# Patient Record
Sex: Female | Born: 1955 | Race: White | Hispanic: No | Marital: Married | Smoking: Former smoker
Health system: Southern US, Community
[De-identification: ages and names within clinical notes are randomized; demographics above are authoritative.]

## PROBLEM LIST (undated history)

## (undated) DIAGNOSIS — S060XAA Concussion with loss of consciousness status unknown, initial encounter: Secondary | ICD-10-CM

## (undated) DIAGNOSIS — IMO0001 Reserved for inherently not codable concepts without codable children: Secondary | ICD-10-CM

## (undated) DIAGNOSIS — D649 Anemia, unspecified: Secondary | ICD-10-CM

## (undated) DIAGNOSIS — S060X9A Concussion with loss of consciousness of unspecified duration, initial encounter: Secondary | ICD-10-CM

## (undated) DIAGNOSIS — Z5189 Encounter for other specified aftercare: Secondary | ICD-10-CM

## (undated) DIAGNOSIS — K5790 Diverticulosis of intestine, part unspecified, without perforation or abscess without bleeding: Secondary | ICD-10-CM

## (undated) DIAGNOSIS — G40909 Epilepsy, unspecified, not intractable, without status epilepticus: Secondary | ICD-10-CM

## (undated) DIAGNOSIS — M1990 Unspecified osteoarthritis, unspecified site: Secondary | ICD-10-CM

## (undated) HISTORY — DX: Epilepsy, unspecified, not intractable, without status epilepticus: G40.909

## (undated) HISTORY — DX: Unspecified osteoarthritis, unspecified site: M19.90

## (undated) HISTORY — PX: BUNIONECTOMY: SHX129

## (undated) HISTORY — DX: Anemia, unspecified: D64.9

## (undated) HISTORY — DX: Diverticulosis of intestine, part unspecified, without perforation or abscess without bleeding: K57.90

## (undated) HISTORY — PX: COLONOSCOPY: SHX174

## (undated) HISTORY — DX: Concussion with loss of consciousness of unspecified duration, initial encounter: S06.0X9A

## (undated) HISTORY — PX: OTHER SURGICAL HISTORY: SHX169

## (undated) HISTORY — DX: Concussion with loss of consciousness status unknown, initial encounter: S06.0XAA

---
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
## 1998-03-05 ENCOUNTER — Other Ambulatory Visit: Admission: RE | Admit: 1998-03-05 | Discharge: 1998-03-05 | Payer: Self-pay | Admitting: Obstetrics and Gynecology

## 2000-04-07 HISTORY — PX: ABDOMINAL HYSTERECTOMY: SHX81

## 2002-12-21 ENCOUNTER — Emergency Department (HOSPITAL_COMMUNITY): Admission: EM | Admit: 2002-12-21 | Discharge: 2002-12-21 | Payer: Self-pay | Admitting: Emergency Medicine

## 2002-12-21 ENCOUNTER — Encounter: Payer: Self-pay | Admitting: Emergency Medicine

## 2005-07-25 ENCOUNTER — Emergency Department (HOSPITAL_COMMUNITY): Admission: EM | Admit: 2005-07-25 | Discharge: 2005-07-25 | Payer: Self-pay | Admitting: Emergency Medicine

## 2009-09-11 ENCOUNTER — Ambulatory Visit (HOSPITAL_COMMUNITY): Admission: RE | Admit: 2009-09-11 | Discharge: 2009-09-11 | Payer: Self-pay | Admitting: Family Medicine

## 2010-07-23 ENCOUNTER — Other Ambulatory Visit (HOSPITAL_COMMUNITY): Payer: Self-pay | Admitting: Internal Medicine

## 2010-07-23 ENCOUNTER — Ambulatory Visit (HOSPITAL_COMMUNITY)
Admission: RE | Admit: 2010-07-23 | Discharge: 2010-07-23 | Disposition: A | Discharge status: Home / Self Care | Payer: BLUE CROSS/BLUE SHIELD | Source: Ambulatory Visit | Attending: Internal Medicine | Admitting: Internal Medicine

## 2010-07-23 DIAGNOSIS — R109 Unspecified abdominal pain: Secondary | ICD-10-CM

## 2010-09-17 ENCOUNTER — Ambulatory Visit (INDEPENDENT_AMBULATORY_CARE_PROVIDER_SITE_OTHER): Payer: BLUE CROSS/BLUE SHIELD | Admitting: Internal Medicine

## 2010-10-15 ENCOUNTER — Ambulatory Visit (INDEPENDENT_AMBULATORY_CARE_PROVIDER_SITE_OTHER): Payer: BLUE CROSS/BLUE SHIELD | Admitting: Internal Medicine

## 2011-03-17 ENCOUNTER — Other Ambulatory Visit (HOSPITAL_COMMUNITY): Payer: Self-pay | Admitting: Family Medicine

## 2011-03-17 DIAGNOSIS — Z139 Encounter for screening, unspecified: Secondary | ICD-10-CM

## 2011-03-20 ENCOUNTER — Ambulatory Visit (HOSPITAL_COMMUNITY): Payer: BC Managed Care – PPO

## 2011-05-26 ENCOUNTER — Encounter (INDEPENDENT_AMBULATORY_CARE_PROVIDER_SITE_OTHER): Payer: Self-pay | Admitting: *Deleted

## 2011-06-03 ENCOUNTER — Other Ambulatory Visit (HOSPITAL_COMMUNITY): Payer: Self-pay | Admitting: Internal Medicine

## 2011-06-03 ENCOUNTER — Ambulatory Visit (HOSPITAL_COMMUNITY)
Admission: RE | Admit: 2011-06-03 | Discharge: 2011-06-03 | Disposition: A | Discharge status: Home / Self Care | Payer: BC Managed Care – PPO | Source: Ambulatory Visit | Attending: Internal Medicine | Admitting: Internal Medicine

## 2011-06-03 DIAGNOSIS — M13179 Monoarthritis, not elsewhere classified, unspecified ankle and foot: Secondary | ICD-10-CM

## 2011-06-03 DIAGNOSIS — M7730 Calcaneal spur, unspecified foot: Secondary | ICD-10-CM | POA: Insufficient documentation

## 2011-06-03 DIAGNOSIS — Z9889 Other specified postprocedural states: Secondary | ICD-10-CM | POA: Insufficient documentation

## 2011-06-03 DIAGNOSIS — M79609 Pain in unspecified limb: Secondary | ICD-10-CM | POA: Insufficient documentation

## 2011-07-29 NOTE — Progress Notes (Signed)
Received a referral from [facility] for pt for screening colonoscopy on 07/28/2011. Saw that Luster Landsberg from Dr. Patty Sermons office has sent a previous letter to pt dated 05/26/2011 to schedule colonoscopy. No letter sent to pt today.

## 2011-07-31 ENCOUNTER — Ambulatory Visit (HOSPITAL_COMMUNITY): Payer: BC Managed Care – PPO

## 2011-09-25 ENCOUNTER — Encounter: Payer: Self-pay | Admitting: Gastroenterology

## 2011-11-14 ENCOUNTER — Encounter: Payer: BC Managed Care – PPO | Admitting: Gastroenterology

## 2011-12-03 ENCOUNTER — Other Ambulatory Visit: Payer: Self-pay

## 2011-12-03 ENCOUNTER — Encounter (HOSPITAL_COMMUNITY): Payer: Self-pay | Admitting: Emergency Medicine

## 2011-12-03 ENCOUNTER — Emergency Department (HOSPITAL_COMMUNITY): Payer: BC Managed Care – PPO

## 2011-12-03 ENCOUNTER — Emergency Department (HOSPITAL_COMMUNITY)
Admission: EM | Admit: 2011-12-03 | Discharge: 2011-12-03 | Disposition: A | Discharge status: Home / Self Care | Payer: BC Managed Care – PPO | Attending: Emergency Medicine | Admitting: Emergency Medicine

## 2011-12-03 DIAGNOSIS — R55 Syncope and collapse: Secondary | ICD-10-CM | POA: Insufficient documentation

## 2011-12-03 DIAGNOSIS — R0602 Shortness of breath: Secondary | ICD-10-CM | POA: Insufficient documentation

## 2011-12-03 DIAGNOSIS — Z7982 Long term (current) use of aspirin: Secondary | ICD-10-CM | POA: Insufficient documentation

## 2011-12-03 HISTORY — DX: Encounter for other specified aftercare: Z51.89

## 2011-12-03 HISTORY — DX: Reserved for inherently not codable concepts without codable children: IMO0001

## 2011-12-03 LAB — BASIC METABOLIC PANEL
BUN: 10 mg/dL (ref 6–23)
CO2: 25 mEq/L (ref 19–32)
Chloride: 105 mEq/L (ref 96–112)
GFR calc Af Amer: >90 mL/min (ref >90)
Glucose, Bld: 112 mg/dL — ABNORMAL HIGH (ref 70–99)
Potassium: 3.9 mEq/L (ref 3.5–5.1)

## 2011-12-03 LAB — CBC WITH DIFFERENTIAL/PLATELET
Basophils Relative: 0 % (ref 0–1)
HCT: 38.2 % (ref 36.0–46.0)
Hemoglobin: 13.1 g/dL (ref 12.0–15.0)
Lymphocytes Relative: 24 % (ref 12–46)
Lymphs Abs: 1.6 10*3/uL (ref 0.7–4.0)
Monocytes Absolute: 0.4 10*3/uL (ref 0.1–1.0)
Monocytes Relative: 5 % (ref 3–12)
Neutro Abs: 4.5 10*3/uL (ref 1.7–7.7)
Neutrophils Relative %: 70 % (ref 43–77)
RBC: 4.43 MIL/uL (ref 3.87–5.11)
WBC: 6.5 10*3/uL (ref 4.0–10.5)

## 2011-12-03 NOTE — ED Notes (Signed)
Patient complained of being dizzy when changing from a lying to sitting position. Patient said she was not dizzy when changing from a sitting position to a standing position.

## 2011-12-03 NOTE — ED Notes (Signed)
Pt started having chest pains. Went to [facility] they told her to come here. Saturday was in attic and got into installation and she believes she breathed it in. Had a sore chest, and bloody nose. Pt states breathing started feeling "tight" lightheaded felt like she was going to pass out.

## 2011-12-03 NOTE — ED Provider Notes (Signed)
History   This chart was scribed for Donnetta Hutching, MD by Melba Coon. The patient was seen in room APA18/APA18 and the patient's care was started at 3:32PM.    CSN: [id number]  Arrival date & time 12/03/11  1514   First MD Initiated Contact with Patient 12/03/11 1512      Chief Complaint  Patient presents with  . Chest Pain    (Consider location/radiation/quality/duration/timing/severity/associated sxs/prior treatment) The history is provided by the patient. No language interpreter was used.   Elizabeth Raymond is a 56 y.o. female who presents to the Emergency Department complaining of intermittent, moderate to severe chest tightness with associated SOB, lightheadedness, and near syncope with an onset 12:30PM today. Pt states that she was at work when she felt the present symptoms along with labored breathing and tongue tingling. Pt states that she felt "blood pumping thru her neck." Pt was heading to her PCP's office with her daughter driving, but she felt so bad that she told her daughter to take her back to her job and call for EMS. Pt's daughter states that she looks better at time of exam. No HA, fever, neck pain, sore throat, rash, back pain, abd pain, n/v/d, dysuria, or extremity pain, edema, weakness, numbness, or tingling. No other pertinent medical symptoms.  PCP: Dr. Kandy Garrison  Past Medical History  Diagnosis Date  . Blood transfusion     Past Surgical History  Procedure Date  . Abdominal hysterectomy 2002  . Fracture surgery 1976    History reviewed. No pertinent family history.  History  Substance Use Topics  . Smoking status: Never Smoker   . Smokeless tobacco: Not on file  . Alcohol Use: No    OB History    Grav Para Term Preterm Abortions TAB SAB Ect Mult Living                  Review of Systems 10 Systems reviewed and all are negative for acute change except as noted in the HPI.   Allergies  Darvocet  Home Medications   Current Outpatient Rx    Name Route Sig Dispense Refill  . ASPIRIN 81 MG PO CHEW Oral Chew 81 mg by mouth once as needed. For chest pain/symptoms      BP 137/84  Pulse 90  Temp 98.6 F (37 C) (Oral)  SpO2 97%  Physical Exam  Nursing note and vitals reviewed. Constitutional: She is oriented to person, place, and time. She appears well-developed and well-nourished.  HENT:  Head: Normocephalic and atraumatic.  Eyes: EOM are normal. Pupils are equal, round, and reactive to light.  Neck: Normal range of motion. Neck supple.  Cardiovascular: Normal rate, normal heart sounds and intact distal pulses.   Pulmonary/Chest: Effort normal and breath sounds normal.  Abdominal: Bowel sounds are normal. She exhibits no distension. There is no tenderness.  Musculoskeletal: Normal range of motion. She exhibits no edema and no tenderness.  Neurological: She is alert and oriented to person, place, and time. She has normal strength. No cranial nerve deficit or sensory deficit.  Skin: Skin is warm and dry. No rash noted.  Psychiatric: She has a normal mood and affect.    ED Course  Procedures (including critical care time)  DIAGNOSTIC STUDIES: Oxygen Saturation is 97% on room air, normal by my interpretation.    COORDINATION OF CARE:  3:37PM - CXR, EKG, and blood w/u will be ordered for the pt.   Labs Reviewed  BASIC METABOLIC PANEL - Abnormal;  Notable for the following:    Glucose, Bld 112 (*)     All other components within normal limits  CBC WITH DIFFERENTIAL  TROPONIN I   Dg Chest Port 1 View  12/03/2011  *RADIOLOGY REPORT*  Clinical Data: Chest pain  PORTABLE CHEST - 1 VIEW  Comparison: Portable exam 1558 hours without priors for comparison  Findings: Normal heart size, mediastinal contours, and pulmonary vascularity. Lungs clear. No pleural effusion or pneumothorax. No acute osseous findings. Question mild osseous demineralization.  IMPRESSION: No acute abnormalities.   Original Report Authenticated By: Lollie Marrow, M.D.      No diagnosis found.   Date: 12/03/2011  Rate: 84  Rhythm: normal sinus rhythm  QRS Axis: normal  Intervals: normal  ST/T Wave abnormalities: normal  Conduction Disutrbances:none  Narrative Interpretation:   Old EKG Reviewed: none available   MDM  Normal physical exam and normal laboratory data including EKG.  Patient has been monitored in the emergency department for 3 hours without ectopy     I personally performed the services described in this documentation, which was scribed in my presence. The recorded information has been reviewed and considered.       Donnetta Hutching, MD 12/03/11 [phone]

## 2011-12-16 ENCOUNTER — Ambulatory Visit (AMBULATORY_SURGERY_CENTER): Payer: BC Managed Care – PPO

## 2011-12-16 ENCOUNTER — Encounter: Payer: Self-pay | Admitting: Gastroenterology

## 2011-12-16 VITALS — Ht 64.0 in | Wt 169.1 lb

## 2011-12-16 DIAGNOSIS — Z1211 Encounter for screening for malignant neoplasm of colon: Secondary | ICD-10-CM

## 2011-12-16 DIAGNOSIS — K649 Unspecified hemorrhoids: Secondary | ICD-10-CM

## 2011-12-16 MED ORDER — MOVIPREP 100 G PO SOLR: ORAL | Status: DC

## 2011-12-24 ENCOUNTER — Ambulatory Visit (AMBULATORY_SURGERY_CENTER): Payer: BC Managed Care – PPO | Admitting: Gastroenterology

## 2011-12-24 ENCOUNTER — Encounter: Payer: Self-pay | Admitting: Gastroenterology

## 2011-12-24 ENCOUNTER — Encounter: Payer: BC Managed Care – PPO | Admitting: Gastroenterology

## 2011-12-24 VITALS — BP 133/76 | HR 85 | Temp 98.2°F | Resp 18 | Ht 64.0 in | Wt 169.0 lb

## 2011-12-24 DIAGNOSIS — K5730 Diverticulosis of large intestine without perforation or abscess without bleeding: Secondary | ICD-10-CM

## 2011-12-24 DIAGNOSIS — K635 Polyp of colon: Secondary | ICD-10-CM

## 2011-12-24 DIAGNOSIS — D126 Benign neoplasm of colon, unspecified: Secondary | ICD-10-CM

## 2011-12-24 DIAGNOSIS — Z1211 Encounter for screening for malignant neoplasm of colon: Secondary | ICD-10-CM

## 2011-12-24 MED ORDER — SODIUM CHLORIDE 0.9 % IV SOLN: 500.0000 mL | INTRAVENOUS | Status: DC

## 2011-12-24 NOTE — Op Note (Signed)
[facility] [address] [place], [address]   COLONOSCOPY PROCEDURE REPORT  PATIENT: Elizabeth Raymond, Elizabeth Raymond  MR#: [id number] BIRTHDATE: 05/30/1955 , 56  yrs. old GENDER: Female ENDOSCOPIST: Mardella Layman, MD, Clementeen Graham REFERRED BY:  Assunta Found, M.D. PROCEDURE DATE:  12/24/2011 PROCEDURE:   Colonoscopy with snare polypectomy ASA CLASS:   Class II INDICATIONS:average risk patient for colon cancer. MEDICATIONS: propofol (Diprivan) 350mg  IV  DESCRIPTION OF PROCEDURE:   After the risks and benefits and of the procedure were explained, informed consent was obtained.  A digital rectal exam revealed no abnormalities of the rectum.    The LB [id number]  endoscope was introduced through the anus and advanced to the cecum, which was identified by both the appendix and ileocecal valve .  The quality of the prep was excellent, using MoviPrep .  The instrument was then slowly withdrawn as the colon was fully examined.     COLON FINDINGS: There was severe diverticulosis noted in the descending colon and sigmoid colon with associated muscular hypertrophy and tortuosity.   A polypoid shaped pedunculated polyp measuring 1.5 cm in size was found at the splenic flexure.  A polypectomy was performed using snare cautery.  The resection was complete and the polyp tissue was completely retrieved.   The colon mucosa was otherwise normal.     Retroflexed views revealed external hemorrhoids.     The scope was then withdrawn from the patient and the procedure completed.  COMPLICATIONS: There were no complications. ENDOSCOPIC IMPRESSION: 1.   There was severe diverticulosis noted in the descending colon and sigmoid colon 2.   Pedunculated polyp measuring 1.5 cm in size was found at the splenic flexure; polypectomy was performed using snare cautery 3.   The colon mucosa was otherwise normal  RECOMMENDATIONS: 1.  continue current medications 2.  High fiber diet 3.  Repeat  colonoscopy in 5 years if polyp adenomatous; otherwise 10 years   REPEAT EXAM:  cc:  _______________________________ eSignedMardella Layman, MD, [facility] 12/24/2011 2:01 PM

## 2011-12-24 NOTE — Patient Instructions (Addendum)
Handouts were given to your care partner on polyps, diverticulosis and high fiber diet.   You may resume your current medications today.  Please call if any questions or concerns.   YOU HAD AN ENDOSCOPIC PROCEDURE TODAY AT THE [facility]: Refer to the procedure report that was given to you for any specific questions about what was found during the examination.  If the procedure report does not answer your questions, please call your gastroenterologist to clarify.  If you requested that your care partner not be given the details of your procedure findings, then the procedure report has been included in a sealed envelope for you to review at your convenience later.  YOU SHOULD EXPECT: Some feelings of bloating in the abdomen. Passage of more gas than usual.  Walking can help get rid of the air that was put into your GI tract during the procedure and reduce the bloating. If you had a lower endoscopy (such as a colonoscopy or flexible sigmoidoscopy) you may notice spotting of blood in your stool or on the toilet paper. If you underwent a bowel prep for your procedure, then you may not have a normal bowel movement for a few days.  DIET: Your first meal following the procedure should be a light meal and then it is ok to progress to your normal diet.  A half-sandwich or bowl of soup is an example of a good first meal.  Heavy or fried foods are harder to digest and may make you feel nauseous or bloated.  Likewise meals heavy in dairy and vegetables can cause extra gas to form and this can also increase the bloating.  Drink plenty of fluids but you should avoid alcoholic beverages for 24 hours.  ACTIVITY: Your care partner should take you home directly after the procedure.  You should plan to take it easy, moving slowly for the rest of the day.  You can resume normal activity the day after the procedure however you should NOT DRIVE or use heavy machinery for 24 hours (because of the sedation medicines  used during the test).    SYMPTOMS TO REPORT IMMEDIATELY: A gastroenterologist can be reached at any hour.  During normal business hours, 8:30 AM to 5:00 PM Monday through Friday, call [phone].  After hours and on weekends, please call the GI answering service at [phone] who will take a message and have the physician on call contact you.   Following lower endoscopy (colonoscopy or flexible sigmoidoscopy):  Excessive amounts of blood in the stool  Significant tenderness or worsening of abdominal pains  Swelling of the abdomen that is new, acute  Fever of 100F or higher    FOLLOW UP: If any biopsies were taken you will be contacted by phone or by letter within the next 1-3 weeks.  Call your gastroenterologist if you have not heard about the biopsies in 3 weeks.  Our staff will call the home number listed on your records the next business day following your procedure to check on you and address any questions or concerns that you may have at that time regarding the information given to you following your procedure. This is a courtesy call and so if there is no answer at the home number and we have not heard from you through the emergency physician on call, we will assume that you have returned to your regular daily activities without incident.  SIGNATURES/CONFIDENTIALITY: You and/or your care partner have signed paperwork which will be entered into   your electronic medical record.  These signatures attest to the fact that that the information above on your After Visit Summary has been reviewed and is understood.  Full responsibility of the confidentiality of this discharge information lies with you and/or your care-partner.  

## 2011-12-24 NOTE — Progress Notes (Signed)
Cough has lessen after the water. Maw

## 2011-12-24 NOTE — Progress Notes (Signed)
The pt has a non-productive cough.  Once she was awake I offered her water.  I encouged her to take a tiny sip.  Maw

## 2011-12-24 NOTE — Progress Notes (Signed)
No complaints noted in the recovery room. Maw  Patient did not experience any of the following events: a burn prior to discharge; a fall within the facility; wrong site/side/patient/procedure/implant event; or a hospital transfer or hospital admission upon discharge from the facility. (G8907) Patient did not have preoperative order for IV antibiotic SSI prophylaxis. (G8918)  

## 2011-12-25 ENCOUNTER — Telehealth: Payer: Self-pay

## 2011-12-25 NOTE — Telephone Encounter (Signed)
  Follow up Call-  Call back number 12/24/2011  Post procedure Call Back phone  # [phone]  Permission to leave phone message Yes     Patient questions:  Do you have a fever, pain , or abdominal swelling? no Pain Score  0 *  Have you tolerated food without any problems? yes  Have you been able to return to your normal activities? yes  Do you have any questions about your discharge instructions: Diet   no Medications  no Follow up visit  no  Do you have questions or concerns about your Care? no  Actions: * If pain score is 4 or above: No action needed, pain <4.

## 2011-12-26 ENCOUNTER — Telehealth: Payer: Self-pay | Admitting: Gastroenterology

## 2011-12-26 ENCOUNTER — Ambulatory Visit (INDEPENDENT_AMBULATORY_CARE_PROVIDER_SITE_OTHER)
Admission: RE | Admit: 2011-12-26 | Discharge: 2011-12-26 | Disposition: A | Discharge status: Home / Self Care | Payer: BC Managed Care – PPO | Source: Ambulatory Visit | Attending: Physician Assistant | Admitting: Physician Assistant

## 2011-12-26 ENCOUNTER — Ambulatory Visit: Payer: BC Managed Care – PPO | Admitting: Physician Assistant

## 2011-12-26 DIAGNOSIS — R0602 Shortness of breath: Secondary | ICD-10-CM

## 2011-12-26 DIAGNOSIS — R05 Cough: Secondary | ICD-10-CM

## 2011-12-26 NOTE — Telephone Encounter (Signed)
Pt misunderstood and went to XRAY only and asked what else she was to do and they didn't know so they sent her home. Per Mike Gip, PA, her chest xray was clear and no signs of aspiration pneumonia; we didn't have a CBC. Since she did not c/o abdominal pain, we will treat it as a bad cough. Use OTC Robitussin DM or another cough syrup; if the cough or SOB worsens, please go to the ER or UC. Please update [place] on Monday; pt stated understanding.

## 2011-12-26 NOTE — Telephone Encounter (Signed)
Pt had COLON on 12/24/11 with polypectomy on pedunculated polyp at splenic flexure. She reports she woke up coughing from the procedure and the coughing has gotten worse and she can't take a deep breath and has a rattle in her chest. She is having BMs, is passing gas, and denies being bloated. Pt advised to come in for a 3:30pm appt unless I call her back. Amy, anything to offer instead of OV? Thanks.

## 2011-12-26 NOTE — Telephone Encounter (Signed)
lmom for pt to call back. Per Mike Gip, PA, have CBC and Chest Xray done prior to her visit at 3:30pm today; r/o aspiration pneumonia.

## 2011-12-30 ENCOUNTER — Encounter: Payer: Self-pay | Admitting: Gastroenterology

## 2015-06-05 ENCOUNTER — Ambulatory Visit (HOSPITAL_COMMUNITY)
Admission: RE | Admit: 2015-06-05 | Discharge: 2015-06-05 | Disposition: A | Discharge status: Home / Self Care | Payer: BC Managed Care – PPO | Source: Ambulatory Visit | Attending: Preventative Medicine | Admitting: Preventative Medicine

## 2015-06-05 ENCOUNTER — Other Ambulatory Visit (HOSPITAL_COMMUNITY): Payer: Self-pay | Admitting: Preventative Medicine

## 2015-06-05 DIAGNOSIS — R1032 Left lower quadrant pain: Secondary | ICD-10-CM | POA: Diagnosis not present

## 2015-06-05 MED ORDER — DIATRIZOATE MEGLUMINE & SODIUM 66-10 % PO SOLN
ORAL | Status: AC
  Filled 2015-06-05: qty 30

## 2015-06-05 MED ORDER — IOHEXOL 300 MG/ML  SOLN
100.0000 mL | Freq: Once | INTRAMUSCULAR | Status: AC | PRN
  Administered 2015-06-05: 100 mL via INTRAVENOUS

## 2015-07-10 ENCOUNTER — Other Ambulatory Visit (HOSPITAL_COMMUNITY): Payer: Self-pay | Admitting: Family Medicine

## 2015-07-10 DIAGNOSIS — Z139 Encounter for screening, unspecified: Secondary | ICD-10-CM

## 2015-07-10 DIAGNOSIS — Z1231 Encounter for screening mammogram for malignant neoplasm of breast: Secondary | ICD-10-CM

## 2015-07-13 ENCOUNTER — Ambulatory Visit (HOSPITAL_COMMUNITY): Payer: BC Managed Care – PPO

## 2015-07-23 ENCOUNTER — Ambulatory Visit (HOSPITAL_COMMUNITY)
Admission: RE | Admit: 2015-07-23 | Discharge: 2015-07-23 | Disposition: A | Discharge status: Home / Self Care | Payer: BC Managed Care – PPO | Source: Ambulatory Visit | Attending: Family Medicine | Admitting: Family Medicine

## 2015-07-23 DIAGNOSIS — Z1231 Encounter for screening mammogram for malignant neoplasm of breast: Secondary | ICD-10-CM

## 2016-06-19 ENCOUNTER — Other Ambulatory Visit (HOSPITAL_COMMUNITY): Payer: Self-pay | Admitting: Internal Medicine

## 2016-06-19 DIAGNOSIS — Z1231 Encounter for screening mammogram for malignant neoplasm of breast: Secondary | ICD-10-CM

## 2016-07-23 ENCOUNTER — Ambulatory Visit (HOSPITAL_COMMUNITY): Payer: BC Managed Care – PPO

## 2016-08-04 ENCOUNTER — Ambulatory Visit (HOSPITAL_COMMUNITY)
Admission: RE | Admit: 2016-08-04 | Discharge: 2016-08-04 | Disposition: A | Discharge status: Home / Self Care | Payer: BC Managed Care – PPO | Source: Ambulatory Visit | Attending: Internal Medicine | Admitting: Internal Medicine

## 2016-08-04 DIAGNOSIS — Z1231 Encounter for screening mammogram for malignant neoplasm of breast: Secondary | ICD-10-CM | POA: Insufficient documentation

## 2016-09-16 ENCOUNTER — Encounter: Payer: Self-pay | Admitting: *Deleted

## 2017-01-21 ENCOUNTER — Encounter: Payer: Self-pay | Admitting: Internal Medicine

## 2017-09-10 ENCOUNTER — Other Ambulatory Visit (HOSPITAL_COMMUNITY): Payer: Self-pay | Admitting: Internal Medicine

## 2017-09-10 DIAGNOSIS — Z1231 Encounter for screening mammogram for malignant neoplasm of breast: Secondary | ICD-10-CM

## 2017-09-16 ENCOUNTER — Ambulatory Visit (HOSPITAL_COMMUNITY): Payer: BC Managed Care – PPO

## 2018-12-15 ENCOUNTER — Other Ambulatory Visit (HOSPITAL_COMMUNITY): Payer: Self-pay | Admitting: Internal Medicine

## 2018-12-15 DIAGNOSIS — Z1231 Encounter for screening mammogram for malignant neoplasm of breast: Secondary | ICD-10-CM

## 2018-12-29 ENCOUNTER — Other Ambulatory Visit: Payer: Self-pay

## 2018-12-29 ENCOUNTER — Ambulatory Visit (HOSPITAL_COMMUNITY)
Admission: RE | Admit: 2018-12-29 | Discharge: 2018-12-29 | Disposition: A | Discharge status: Home / Self Care | Payer: BC Managed Care – PPO | Source: Ambulatory Visit | Attending: Internal Medicine | Admitting: Internal Medicine

## 2018-12-29 DIAGNOSIS — Z1231 Encounter for screening mammogram for malignant neoplasm of breast: Secondary | ICD-10-CM | POA: Diagnosis not present

## 2019-01-03 ENCOUNTER — Other Ambulatory Visit (HOSPITAL_COMMUNITY): Payer: Self-pay | Admitting: Internal Medicine

## 2019-01-03 DIAGNOSIS — R928 Other abnormal and inconclusive findings on diagnostic imaging of breast: Secondary | ICD-10-CM

## 2019-01-12 ENCOUNTER — Ambulatory Visit (HOSPITAL_COMMUNITY)
Admission: RE | Admit: 2019-01-12 | Discharge: 2019-01-12 | Disposition: A | Discharge status: Home / Self Care | Payer: BC Managed Care – PPO | Source: Ambulatory Visit | Attending: Internal Medicine | Admitting: Internal Medicine

## 2019-01-12 ENCOUNTER — Ambulatory Visit (HOSPITAL_COMMUNITY): Admission: RE | Admit: 2019-01-12 | Payer: BC Managed Care – PPO | Source: Ambulatory Visit

## 2019-01-12 ENCOUNTER — Other Ambulatory Visit: Payer: Self-pay

## 2019-01-12 DIAGNOSIS — R928 Other abnormal and inconclusive findings on diagnostic imaging of breast: Secondary | ICD-10-CM | POA: Diagnosis not present

## 2019-02-13 ENCOUNTER — Other Ambulatory Visit: Payer: Self-pay

## 2019-02-13 ENCOUNTER — Emergency Department (HOSPITAL_COMMUNITY): Payer: BC Managed Care – PPO

## 2019-02-13 ENCOUNTER — Emergency Department (HOSPITAL_COMMUNITY)
Admission: EM | Admit: 2019-02-13 | Discharge: 2019-02-13 | Disposition: A | Discharge status: Home / Self Care | Payer: BC Managed Care – PPO | Attending: Emergency Medicine | Admitting: Emergency Medicine

## 2019-02-13 DIAGNOSIS — Z87891 Personal history of nicotine dependence: Secondary | ICD-10-CM | POA: Diagnosis not present

## 2019-02-13 DIAGNOSIS — K5732 Diverticulitis of large intestine without perforation or abscess without bleeding: Secondary | ICD-10-CM | POA: Insufficient documentation

## 2019-02-13 DIAGNOSIS — K5900 Constipation, unspecified: Secondary | ICD-10-CM | POA: Diagnosis present

## 2019-02-13 DIAGNOSIS — Z79899 Other long term (current) drug therapy: Secondary | ICD-10-CM | POA: Insufficient documentation

## 2019-02-13 DIAGNOSIS — G40909 Epilepsy, unspecified, not intractable, without status epilepticus: Secondary | ICD-10-CM | POA: Insufficient documentation

## 2019-02-13 LAB — CBC WITH DIFFERENTIAL/PLATELET
Abs Immature Granulocytes: 0.03 10*3/uL (ref 0.00–0.07)
Basophils Absolute: 0 10*3/uL (ref 0.0–0.1)
Basophils Relative: 0 %
Eosinophils Absolute: 0.1 10*3/uL (ref 0.0–0.5)
Eosinophils Relative: 1 %
HCT: 41.5 % (ref 36.0–46.0)
Hemoglobin: 13.4 g/dL (ref 12.0–15.0)
Immature Granulocytes: 0 %
Lymphocytes Relative: 21 %
Lymphs Abs: 2.2 10*3/uL (ref 0.7–4.0)
MCH: 28.9 pg (ref 26.0–34.0)
MCHC: 32.3 g/dL (ref 30.0–36.0)
MCV: 89.4 fL (ref 80.0–100.0)
Monocytes Absolute: 0.8 10*3/uL (ref 0.1–1.0)
Monocytes Relative: 8 %
Neutro Abs: 7.2 10*3/uL (ref 1.7–7.7)
Neutrophils Relative %: 70 %
Platelets: 164 10*3/uL (ref 150–400)
RBC: 4.64 MIL/uL (ref 3.87–5.11)
RDW: 11.9 % (ref 11.5–15.5)
WBC: 10.4 10*3/uL (ref 4.0–10.5)
nRBC: 0 % (ref 0.0–0.2)

## 2019-02-13 LAB — URINALYSIS, ROUTINE W REFLEX MICROSCOPIC
Bacteria, UA: NONE SEEN
Bilirubin Urine: NEGATIVE
Glucose, UA: NEGATIVE mg/dL
Ketones, ur: NEGATIVE mg/dL
Leukocytes,Ua: NEGATIVE
Nitrite: NEGATIVE
Protein, ur: NEGATIVE mg/dL
Specific Gravity, Urine: 1.002 — ABNORMAL LOW (ref 1.005–1.030)
pH: 6 (ref 5.0–8.0)

## 2019-02-13 LAB — COMPREHENSIVE METABOLIC PANEL
ALT: 13 U/L (ref 0–44)
AST: 17 U/L (ref 15–41)
Albumin: 4.1 g/dL (ref 3.5–5.0)
Alkaline Phosphatase: 57 U/L (ref 38–126)
Anion gap: 9 (ref 5–15)
BUN: 9 mg/dL (ref 8–23)
CO2: 24 mmol/L (ref 22–32)
Calcium: 9.2 mg/dL (ref 8.9–10.3)
Chloride: 105 mmol/L (ref 98–111)
Creatinine, Ser: 0.54 mg/dL (ref 0.44–1.00)
GFR calc Af Amer: >60 mL/min (ref >60)
GFR calc non Af Amer: >60 mL/min (ref >60)
Glucose, Bld: 126 mg/dL — ABNORMAL HIGH (ref 70–99)
Potassium: 3.7 mmol/L (ref 3.5–5.1)
Sodium: 138 mmol/L (ref 135–145)
Total Bilirubin: 1.1 mg/dL (ref 0.3–1.2)
Total Protein: 6.9 g/dL (ref 6.5–8.1)

## 2019-02-13 LAB — LIPASE, BLOOD: Lipase: 32 U/L (ref 11–51)

## 2019-02-13 MED ORDER — ONDANSETRON 4 MG PO TBDP: 4.0000 mg | ORAL_TABLET | Freq: Three times a day (TID) | ORAL | 0 refills | Status: DC | PRN

## 2019-02-13 MED ORDER — IOHEXOL 300 MG/ML  SOLN
100.0000 mL | Freq: Once | INTRAMUSCULAR | Status: AC | PRN
  Administered 2019-02-13: 100 mL via INTRAVENOUS

## 2019-02-13 MED ORDER — SODIUM CHLORIDE 0.9 % IV BOLUS
1000.0000 mL | Freq: Once | INTRAVENOUS | Status: AC
  Administered 2019-02-13: 1000 mL via INTRAVENOUS

## 2019-02-13 MED ORDER — AMOXICILLIN-POT CLAVULANATE 875-125 MG PO TABS
1.0000 | ORAL_TABLET | Freq: Once | ORAL | Status: AC
  Administered 2019-02-13: 1 via ORAL
  Filled 2019-02-13: qty 1

## 2019-02-13 MED ORDER — AMOXICILLIN-POT CLAVULANATE 875-125 MG PO TABS: 1.0000 | ORAL_TABLET | Freq: Two times a day (BID) | ORAL | 0 refills | Status: DC

## 2019-02-13 NOTE — ED Notes (Signed)
From CT 

## 2019-02-13 NOTE — Discharge Instructions (Addendum)
You have been diagnosed today with Diverticulitis.  At this time there does not appear to be the presence of an emergent medical condition, however there is always the potential for conditions to change. Please read and follow the below instructions.  Please return to the Emergency Department immediately for any new or worsening symptoms or if your symptoms do not improve within 2-3 days. Please be sure to follow up with your Primary Care Provider within one week regarding your visit today; please call their office to schedule an appointment even if you are feeling better for a follow-up visit. Your CT scan showed diverticulitis and a small sliding-type hiatal hernia.  Discussed these findings with your primary care provider and your gastroenterologist at your next visit.  You may call the gastroenterologist Dr. Work on your discharge paperwork tomorrow morning to schedule a follow-up visit. Please take the antibiotic Augmentin as prescribed twice daily for the next 10 days for treatment of your infection.  Please be sure to drink plenty water and get plenty of rest. You may use the nausea medication Zofran as prescribed.  Get help right away if: Your pain gets worse. Your problems do not get better. Your problems get worse very fast. You have a fever. You throw up (vomit) more than one time. You have poop that is: Bloody. Black. Tarry. You have any new/concerning or worsening symptoms  Please read the additional information packets attached to your discharge summary.  Do not take your medicine if  develop an itchy rash, swelling in your mouth or lips, or difficulty breathing; call 911 and seek immediate emergency medical attention if this occurs.  Note: Portions of this text may have been transcribed using voice recognition software. Every effort was made to ensure accuracy; however, inadvertent computerized transcription errors may still be present.

## 2019-02-13 NOTE — ED Notes (Signed)
Pt given msg from her son in law and will call upon returning home

## 2019-02-13 NOTE — ED Triage Notes (Signed)
Pt reports lower abd pain starting last Tuesday took pepto bismol on Wednesday. Took a laxative of Friday and had some runny stool and a small BM on Saturday. Hx of diverticulitis and polyps. Continues to have a lot of abd pain.

## 2019-02-13 NOTE — ED Notes (Signed)
Call to ascertain when pt will go to CT

## 2019-02-13 NOTE — ED Notes (Signed)
Pt requests to know why she is taking antibiotic  Message to PA to ask him to speak with pt

## 2019-02-13 NOTE — ED Notes (Signed)
Pt reports when she defecates she feels that she does not empty Used dulcolax suppositories but no other OTC meds  Education: Metamucil, miralax, mag citrate for constipation  Increased fibers   Pt request "xray"

## 2019-02-13 NOTE — ED Notes (Signed)
Message to pt.
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
## 2019-02-13 NOTE — ED Notes (Signed)
Awaiting CT read and dispo

## 2019-02-17 ENCOUNTER — Telehealth: Payer: Self-pay | Admitting: Nurse Practitioner

## 2019-02-17 ENCOUNTER — Encounter: Payer: Self-pay | Admitting: Nurse Practitioner

## 2019-02-17 ENCOUNTER — Ambulatory Visit: Payer: BC Managed Care – PPO | Admitting: Nurse Practitioner

## 2019-02-17 VITALS — BP 104/70 | HR 87 | Temp 97.2°F | Ht 64.0 in | Wt 165.0 lb

## 2019-02-17 DIAGNOSIS — K5792 Diverticulitis of intestine, part unspecified, without perforation or abscess without bleeding: Secondary | ICD-10-CM

## 2019-02-17 DIAGNOSIS — K219 Gastro-esophageal reflux disease without esophagitis: Secondary | ICD-10-CM

## 2019-02-17 DIAGNOSIS — Z1159 Encounter for screening for other viral diseases: Secondary | ICD-10-CM

## 2019-02-17 DIAGNOSIS — K5900 Constipation, unspecified: Secondary | ICD-10-CM | POA: Diagnosis not present
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
## 2019-02-17 NOTE — Patient Instructions (Signed)
If you are age 62 or older, your body mass index should be between 23-30. Your Body mass index is 28.32 kg/m. If this is out of the aforementioned range listed, please consider follow up with your Primary Care Provider.  If you are age 51 or younger, your body mass index should be between 19-25. Your Body mass index is 28.32 kg/m. If this is out of the aformentioned range listed, please consider follow up with your Primary Care Provider.   You have been scheduled for a colonoscopy. Please follow written instructions given to you at your visit today.  Please pick up your prep supplies at the pharmacy within the next 1-3 days. If you use inhalers (even only as needed), please bring them with you on the day of your procedure. Your physician has requested that you go to www.startemmi.com and enter the access code given to you at your visit today. This web site gives a general overview about your procedure. However, you should still follow specific instructions given to you by our office regarding your preparation for the procedure.  You have been given GERD literature.  Start Metamucil after completion of antibiotics.  Thank you for choosing me and [facility].   Tye Savoy, NP

## 2019-02-18 ENCOUNTER — Other Ambulatory Visit: Payer: Self-pay

## 2019-02-18 MED ORDER — NA SULFATE-K SULFATE-MG SULF 17.5-3.13-1.6 GM/177ML PO SOLN: ORAL | 0 refills | Status: DC

## 2019-02-24 ENCOUNTER — Telehealth: Payer: Self-pay | Admitting: Nurse Practitioner

## 2019-02-25 NOTE — Telephone Encounter (Signed)
Called patient. She states she feels better now and doesn't need the antibiotic.

## 2019-02-25 NOTE — Telephone Encounter (Signed)
Elizabeth Raymond, If she is still hurting / still tender then yes I think additional round of antibiotics is reasonable. If continues to feel better then let's hold off. If requires antibiotics please give 7 days of cipro 250 mg bid and flagyl 500 mg TID. Thanks.

## 2019-03-24 ENCOUNTER — Other Ambulatory Visit: Payer: Self-pay | Admitting: Internal Medicine

## 2019-03-24 ENCOUNTER — Ambulatory Visit (INDEPENDENT_AMBULATORY_CARE_PROVIDER_SITE_OTHER): Payer: BC Managed Care – PPO

## 2019-03-24 DIAGNOSIS — Z1159 Encounter for screening for other viral diseases: Secondary | ICD-10-CM

## 2019-03-24 LAB — SARS CORONAVIRUS 2 (TAT 6-24 HRS): SARS Coronavirus 2: NEGATIVE

## 2019-03-28 ENCOUNTER — Encounter: Payer: Self-pay | Admitting: Internal Medicine

## 2019-03-28 ENCOUNTER — Ambulatory Visit (AMBULATORY_SURGERY_CENTER): Payer: BC Managed Care – PPO | Admitting: Internal Medicine

## 2019-03-28 ENCOUNTER — Other Ambulatory Visit: Payer: Self-pay

## 2019-03-28 VITALS — BP 120/62 | HR 74 | Temp 97.5°F | Resp 13 | Ht 64.0 in | Wt 165.0 lb

## 2019-03-28 DIAGNOSIS — Z860101 Personal history of adenomatous and serrated colon polyps: Secondary | ICD-10-CM | POA: Insufficient documentation

## 2019-03-28 DIAGNOSIS — Z86010 Personal history of colonic polyps: Secondary | ICD-10-CM | POA: Diagnosis present

## 2019-03-28 DIAGNOSIS — K5792 Diverticulitis of intestine, part unspecified, without perforation or abscess without bleeding: Secondary | ICD-10-CM

## 2019-03-28 DIAGNOSIS — D124 Benign neoplasm of descending colon: Secondary | ICD-10-CM

## 2019-03-28 DIAGNOSIS — K5732 Diverticulitis of large intestine without perforation or abscess without bleeding: Secondary | ICD-10-CM

## 2019-03-28 DIAGNOSIS — D125 Benign neoplasm of sigmoid colon: Secondary | ICD-10-CM

## 2019-03-28 HISTORY — DX: Diverticulitis of large intestine without perforation or abscess without bleeding: K57.32

## 2019-03-28 HISTORY — DX: Personal history of colonic polyps: Z86.010

## 2019-03-28 HISTORY — DX: Personal history of adenomatous and serrated colon polyps: Z86.0101

## 2019-03-28 MED ORDER — SODIUM CHLORIDE 0.9 % IV SOLN: 500.0000 mL | Freq: Once | INTRAVENOUS | Status: DC

## 2019-03-28 NOTE — Progress Notes (Signed)
Temp Jb  VS CW  Pt's states no medical or surgical changes since previsit or office visit.  Admitting RN reviewed

## 2019-03-28 NOTE — Progress Notes (Signed)
A/ox3, pleased with MAC, report to RN 

## 2019-03-28 NOTE — Progress Notes (Signed)
Called to room to assist during endoscopic procedure.  Patient ID and intended procedure confirmed with present staff. Received instructions for my participation in the procedure from the performing physician.  

## 2019-03-28 NOTE — Op Note (Signed)
[place] Patient Name: Elizabeth Raymond Procedure Date: 03/28/2019 7:55 AM MRN: [id number] Endoscopist: Gatha Mayer , MD Age: 63 Referring MD:  Date of Birth: November 22, 1955 Gender: Female Account #: [id number] Procedure:                Colonoscopy Indications:              Surveillance: Personal history of adenomatous                            polyps on last colonoscopy > 5 years ago Medicines:                Propofol per Anesthesia, Monitored Anesthesia Care Procedure:                Pre-Anesthesia Assessment:                           - Prior to the procedure, a History and Physical                            was performed, and patient medications and                            allergies were reviewed. The patient's tolerance of                            previous anesthesia was also reviewed. The risks                            and benefits of the procedure and the sedation                            options and risks were discussed with the patient.                            All questions were answered, and informed consent                            was obtained. Prior Anticoagulants: The patient has                            taken no previous anticoagulant or antiplatelet                            agents. ASA Grade Assessment: II - A patient with                            mild systemic disease. After reviewing the risks                            and benefits, the patient was deemed in                            satisfactory condition to undergo the procedure.  After obtaining informed consent, the colonoscope                            was passed under direct vision. Throughout the                            procedure, the patient's blood pressure, pulse, and                            oxygen saturations were monitored continuously. The                            Colonoscope was introduced through the anus and   advanced to the the cecum, identified by                            appendiceal orifice and ileocecal valve. The                            colonoscopy was performed without difficulty. The                            patient tolerated the procedure well. The quality                            of the bowel preparation was excellent. The bowel                            preparation used was SUPREP via split dose                            instruction. The ileocecal valve, appendiceal                            orifice, and rectum were photographed. Scope In: 8:07:46 AM Scope Out: 8:23:12 AM Scope Withdrawal Time: 0 hours 12 minutes 0 seconds  Total Procedure Duration: 0 hours 15 minutes 26 seconds  Findings:                 The perianal and digital rectal examinations were                            normal.                           A diminutive polyp was found in the proximal                            sigmoid colon. The polyp was sessile. The polyp was                            removed with a cold snare. Resection and retrieval                            were complete. Verification of patient  identification for the specimen was done. Estimated                            blood loss was minimal.                           A 1 mm polyp was found in the distal descending                            colon. The polyp was sessile. The polyp was removed                            with a cold biopsy forceps. Resection and retrieval                            were complete. Verification of patient                            identification for the specimen was done. Estimated                            blood loss was minimal.                           Many small and large-mouthed diverticula were found                            in the sigmoid colon and descending colon.                           The exam was otherwise without abnormality on                             direct and retroflexion views. Complications:            No immediate complications. Estimated Blood Loss:     Estimated blood loss was minimal. Impression:               - One diminutive polyp in the proximal sigmoid                            colon, removed with a cold snare. Resected and                            retrieved.                           - One 1 mm polyp in the distal descending colon,                            removed with a cold biopsy forceps. Resected and                            retrieved.                           -  Diverticulosis in the sigmoid colon and in the                            descending colon.                           - The examination was otherwise normal on direct                            and retroflexion views.                           - Personal history of colonic polyp 15 mm TV                            adenoma 2013. Recommendation:           - Patient has a contact number available for                            emergencies. The signs and symptoms of potential                            delayed complications were discussed with the                            patient. Return to normal activities tomorrow.                            Written discharge instructions were provided to the                            patient.                           - Resume previous diet.                           - Continue present medications.                           - Repeat colonoscopy is recommended for                            surveillance. The colonoscopy date will be                            determined after pathology results from today's                            exam become available for review. Gatha Mayer, MD 03/28/2019 8:35:34 AM This report has been signed electronically.

## 2019-03-28 NOTE — Patient Instructions (Addendum)
I found and removed 2 tiny, benign-appearing polyps. Also saw diverticulosis - no diverticulitis.  I will let you know pathology results and when to have another routine colonoscopy by mail and/or My Chart.  I appreciate the opportunity to care for you. Gatha Mayer, MD, [facility]   Handouts given for polyps and diverticulosis.  YOU HAD AN ENDOSCOPIC PROCEDURE TODAY AT [facility]:   Refer to the procedure report that was given to you for any specific questions about what was found during the examination.  If the procedure report does not answer your questions, please call your gastroenterologist to clarify.  If you requested that your care partner not be given the details of your procedure findings, then the procedure report has been included in a sealed envelope for you to review at your convenience later.  YOU SHOULD EXPECT: Some feelings of bloating in the abdomen. Passage of more gas than usual.  Walking can help get rid of the air that was put into your GI tract during the procedure and reduce the bloating. If you had a lower endoscopy (such as a colonoscopy or flexible sigmoidoscopy) you may notice spotting of blood in your stool or on the toilet paper. If you underwent a bowel prep for your procedure, you may not have a normal bowel movement for a few days.  Please Note:  You might notice some irritation and congestion in your nose or some drainage.  This is from the oxygen used during your procedure.  There is no need for concern and it should clear up in a day or so.  SYMPTOMS TO REPORT IMMEDIATELY:   Following lower endoscopy (colonoscopy or flexible sigmoidoscopy):  Excessive amounts of blood in the stool  Significant tenderness or worsening of abdominal pains  Swelling of the abdomen that is new, acute  Fever of 100F or higher  For urgent or emergent issues, a gastroenterologist can be reached at any hour by calling [phone].   DIET:  We do recommend a  small meal at first, but then you may proceed to your regular diet.  Drink plenty of fluids but you should avoid alcoholic beverages for 24 hours.  ACTIVITY:  You should plan to take it easy for the rest of today and you should NOT DRIVE or use heavy machinery until tomorrow (because of the sedation medicines used during the test).    FOLLOW UP: Our staff will call the number listed on your records 48-72 hours following your procedure to check on you and address any questions or concerns that you may have regarding the information given to you following your procedure. If we do not reach you, we will leave a message.  We will attempt to reach you two times.  During this call, we will ask if you have developed any symptoms of COVID 19. If you develop any symptoms (ie: fever, flu-like symptoms, shortness of breath, cough etc.) before then, please call [phone].  If you test positive for Covid 19 in the 2 weeks post procedure, please call and report this information to [place].    If any biopsies were taken you will be contacted by phone or by letter within the next 1-3 weeks.  Please call us at [phone] if you have not heard about the biopsies in 3 weeks.    SIGNATURES/CONFIDENTIALITY: You and/or your care partner have signed paperwork which will be entered into your electronic medical record.  These signatures attest to the fact that that the  information above on your After Visit Summary has been reviewed and is understood.  Full responsibility of the confidentiality of this discharge information lies with you and/or your care-partner.

## 2019-03-30 ENCOUNTER — Telehealth: Payer: Self-pay | Admitting: *Deleted

## 2019-03-30 ENCOUNTER — Telehealth: Payer: Self-pay

## 2019-03-30 NOTE — Telephone Encounter (Signed)
1st follow up call made.  NALM 

## 2019-03-30 NOTE — Telephone Encounter (Signed)
  Follow up Call-  Call back number 03/28/2019  Post procedure Call Back phone  # [phone]  Permission to leave phone message Yes  Some recent data might be hidden     Patient questions:  Do you have a fever, pain , or abdominal swelling? No. Pain Score  0 *  Have you tolerated food without any problems? Yes.    Have you been able to return to your normal activities? Yes.    Do you have any questions about your discharge instructions: Diet   No. Medications  No. Follow up visit  No.  Do you have questions or concerns about your Care? No.  Actions: * If pain score is 4 or above: No action needed, pain <4.  1. Have you developed a fever since your procedure? no  2.   Have you had an respiratory symptoms (SOB or cough) since your procedure? no  3.   Have you tested positive for COVID 19 since your procedure no  4.   Have you had any family members/close contacts diagnosed with the COVID 19 since your procedure?  no   If yes to any of these questions please route to Joylene John, RN and Alphonsa Gin, Therapist, sports.

## 2019-04-03 ENCOUNTER — Emergency Department (HOSPITAL_COMMUNITY): Payer: BC Managed Care – PPO

## 2019-04-03 ENCOUNTER — Encounter (HOSPITAL_COMMUNITY): Payer: Self-pay | Admitting: Emergency Medicine

## 2019-04-03 ENCOUNTER — Emergency Department (HOSPITAL_COMMUNITY)
Admission: EM | Admit: 2019-04-03 | Discharge: 2019-04-03 | Disposition: A | Discharge status: Home / Self Care | Payer: BC Managed Care – PPO | Attending: Emergency Medicine | Admitting: Emergency Medicine

## 2019-04-03 ENCOUNTER — Other Ambulatory Visit: Payer: Self-pay

## 2019-04-03 DIAGNOSIS — R55 Syncope and collapse: Secondary | ICD-10-CM | POA: Insufficient documentation

## 2019-04-03 DIAGNOSIS — Z87891 Personal history of nicotine dependence: Secondary | ICD-10-CM | POA: Diagnosis not present

## 2019-04-03 DIAGNOSIS — Z20828 Contact with and (suspected) exposure to other viral communicable diseases: Secondary | ICD-10-CM | POA: Diagnosis not present

## 2019-04-03 DIAGNOSIS — D126 Benign neoplasm of colon, unspecified: Secondary | ICD-10-CM | POA: Diagnosis not present

## 2019-04-03 DIAGNOSIS — K5732 Diverticulitis of large intestine without perforation or abscess without bleeding: Secondary | ICD-10-CM | POA: Insufficient documentation

## 2019-04-03 DIAGNOSIS — R1032 Left lower quadrant pain: Secondary | ICD-10-CM | POA: Diagnosis present

## 2019-04-03 LAB — COMPREHENSIVE METABOLIC PANEL
ALT: 14 U/L (ref 0–44)
AST: 18 U/L (ref 15–41)
Albumin: 3.9 g/dL (ref 3.5–5.0)
Alkaline Phosphatase: 63 U/L (ref 38–126)
Anion gap: 11 (ref 5–15)
BUN: 10 mg/dL (ref 8–23)
CO2: 25 mmol/L (ref 22–32)
Calcium: 8.8 mg/dL — ABNORMAL LOW (ref 8.9–10.3)
Chloride: 100 mmol/L (ref 98–111)
Creatinine, Ser: 0.64 mg/dL (ref 0.44–1.00)
GFR calc Af Amer: >60 mL/min (ref >60)
GFR calc non Af Amer: >60 mL/min (ref >60)
Glucose, Bld: 103 mg/dL — ABNORMAL HIGH (ref 70–99)
Potassium: 3.6 mmol/L (ref 3.5–5.1)
Sodium: 136 mmol/L (ref 135–145)
Total Bilirubin: 0.9 mg/dL (ref 0.3–1.2)
Total Protein: 6.5 g/dL (ref 6.5–8.1)

## 2019-04-03 LAB — URINALYSIS, ROUTINE W REFLEX MICROSCOPIC
Bilirubin Urine: NEGATIVE
Glucose, UA: NEGATIVE mg/dL
Hgb urine dipstick: NEGATIVE
Ketones, ur: NEGATIVE mg/dL
Leukocytes,Ua: NEGATIVE
Nitrite: NEGATIVE
Protein, ur: NEGATIVE mg/dL
Specific Gravity, Urine: 1.004 — ABNORMAL LOW (ref 1.005–1.030)
pH: 6 (ref 5.0–8.0)

## 2019-04-03 LAB — POC OCCULT BLOOD, ED: Fecal Occult Bld: NEGATIVE

## 2019-04-03 LAB — CBC WITH DIFFERENTIAL/PLATELET
Abs Immature Granulocytes: 0.03 10*3/uL (ref 0.00–0.07)
Basophils Absolute: 0.1 10*3/uL (ref 0.0–0.1)
Basophils Relative: 1 %
Eosinophils Absolute: 0.1 10*3/uL (ref 0.0–0.5)
Eosinophils Relative: 1 %
HCT: 40.6 % (ref 36.0–46.0)
Hemoglobin: 13.3 g/dL (ref 12.0–15.0)
Immature Granulocytes: 0 %
Lymphocytes Relative: 18 %
Lymphs Abs: 1.8 10*3/uL (ref 0.7–4.0)
MCH: 29.6 pg (ref 26.0–34.0)
MCHC: 32.8 g/dL (ref 30.0–36.0)
MCV: 90.4 fL (ref 80.0–100.0)
Monocytes Absolute: 0.7 10*3/uL (ref 0.1–1.0)
Monocytes Relative: 8 %
Neutro Abs: 6.9 10*3/uL (ref 1.7–7.7)
Neutrophils Relative %: 72 %
Platelets: 202 10*3/uL (ref 150–400)
RBC: 4.49 MIL/uL (ref 3.87–5.11)
RDW: 12.2 % (ref 11.5–15.5)
WBC: 9.6 10*3/uL (ref 4.0–10.5)
nRBC: 0 % (ref 0.0–0.2)

## 2019-04-03 LAB — LIPASE, BLOOD: Lipase: 35 U/L (ref 11–51)

## 2019-04-03 MED ORDER — IOHEXOL 300 MG/ML  SOLN
100.0000 mL | Freq: Once | INTRAMUSCULAR | Status: AC | PRN
  Administered 2019-04-03: 100 mL via INTRAVENOUS

## 2019-04-03 NOTE — ED Triage Notes (Signed)
Pt brought in by ems for evaluation of near syncope and abdominal pain. States she had a colonoscopy Monday and began having some diarrhea this morning prior to her near syncopal episode. EMS reported pt was orthostatic with bp dropping from 0000000 to 123XX123 systolic.
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
## 2019-04-03 NOTE — Discharge Instructions (Signed)
Stay hydrated and follow up closely with your doctor for further care.  Fortunately no evidence of diverticulitis and no signs of complication from recent colonoscopy.

## 2019-04-04 LAB — SARS CORONAVIRUS 2 (TAT 6-24 HRS): SARS Coronavirus 2: NEGATIVE

## 2019-04-07 ENCOUNTER — Encounter: Payer: Self-pay | Admitting: Internal Medicine

## 2019-06-07 NOTE — Telephone Encounter (Signed)
Closing encounter

## 2020-01-20 IMAGING — CT CT ABD-PELV W/ CM
2 of 5 series · 16 of 46 positions shown, 18 images · IV contrast (omnipaque)
Comparison: 06/05/2015

CLINICAL DATA: Abdominal pain for several days

EXAM:
CT ABDOMEN AND PELVIS WITH CONTRAST
TECHNIQUE: Multidetector CT imaging of the abdomen and pelvis was performed
using the standard protocol following bolus administration of
intravenous contrast.
CONTRAST:  100mL OMNIPAQUE IOHEXOL 300 MG/ML  SOLN

[Series 2: axial st · axial · 0.73mm/px · z∈[-641,-221]mm · 13 of 96 slices shown, 15 images]
[im 6/96  soft-tissue]
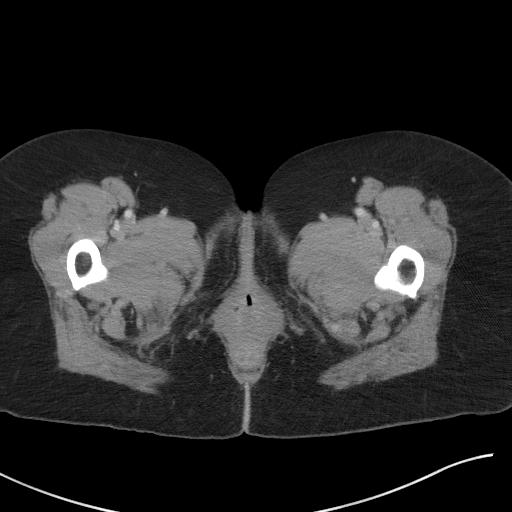
[im 6/96  bone]
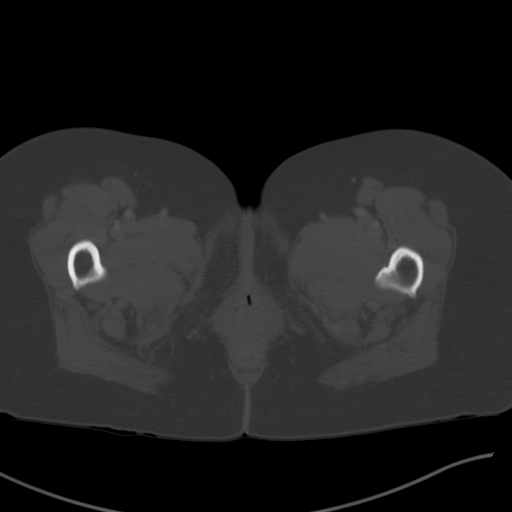
[im 12/96  soft-tissue]
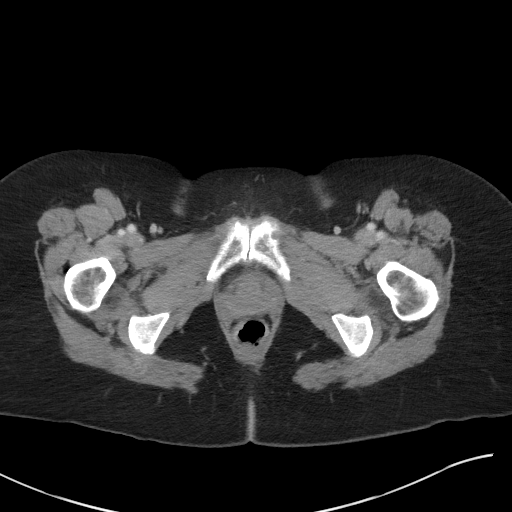
[im 23/96  soft-tissue]
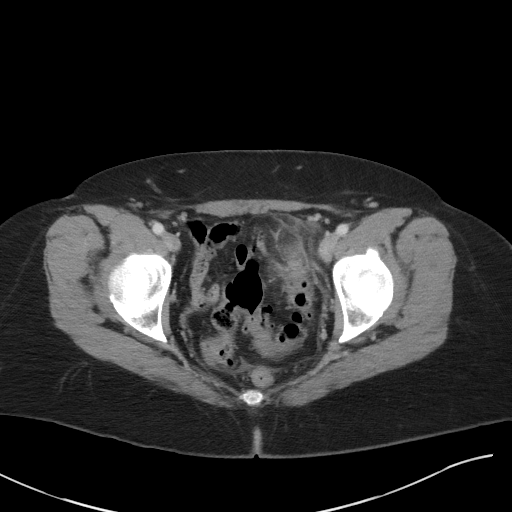
[im 28/96  soft-tissue]
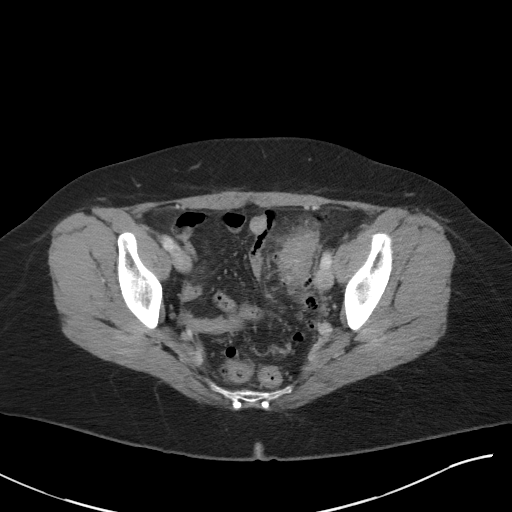
[im 34/96  soft-tissue]
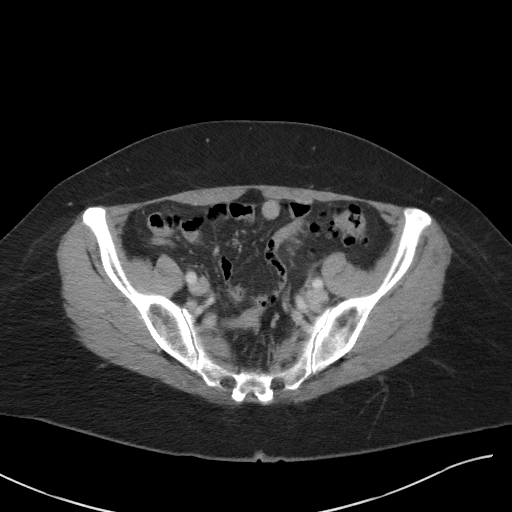
[im 40/96  soft-tissue]
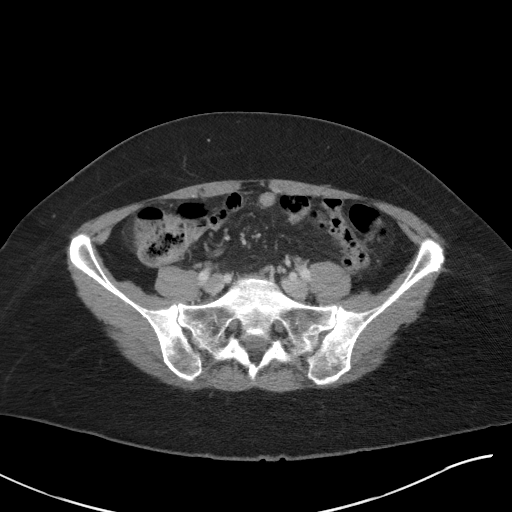
[im 51/96  soft-tissue]
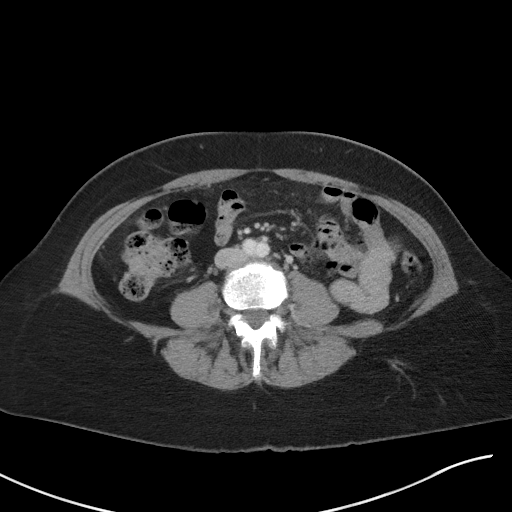
[im 56/96  soft-tissue]
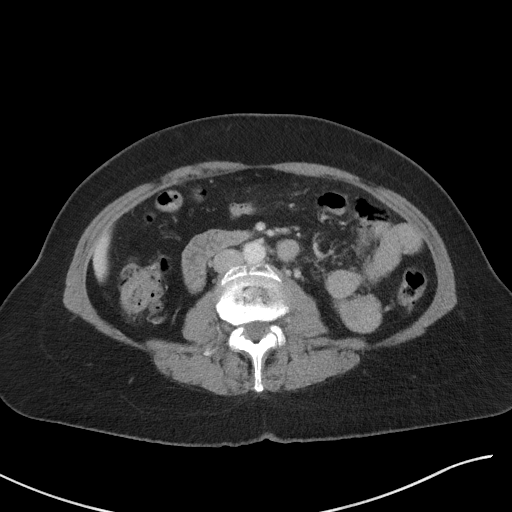
[im 62/96  soft-tissue]
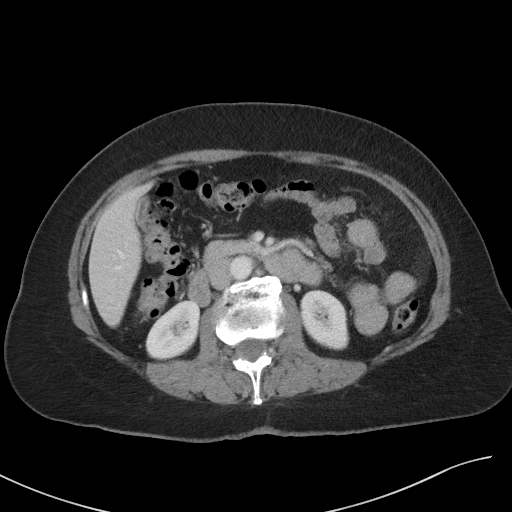
[im 62/96  bone]
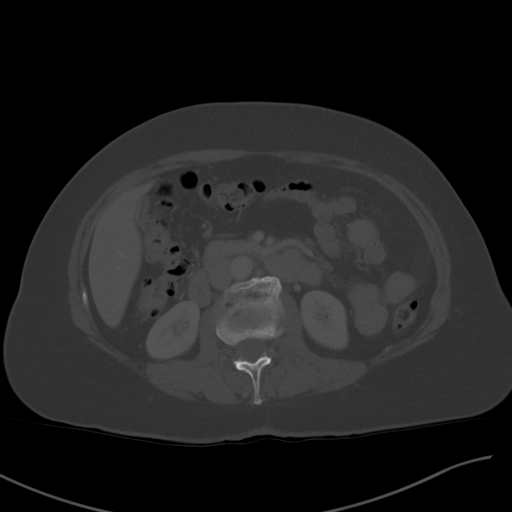
[im 68/96  soft-tissue]
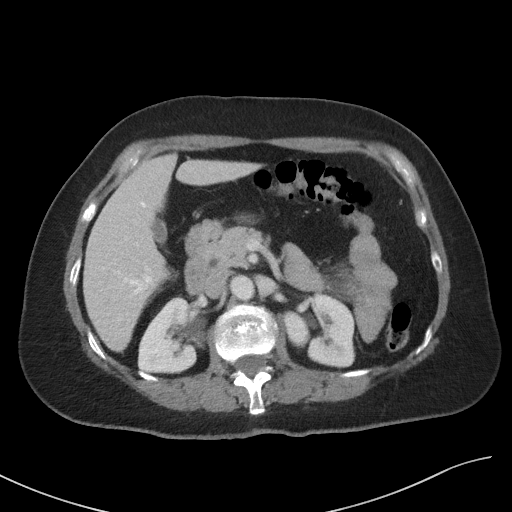
[im 73/96  soft-tissue]
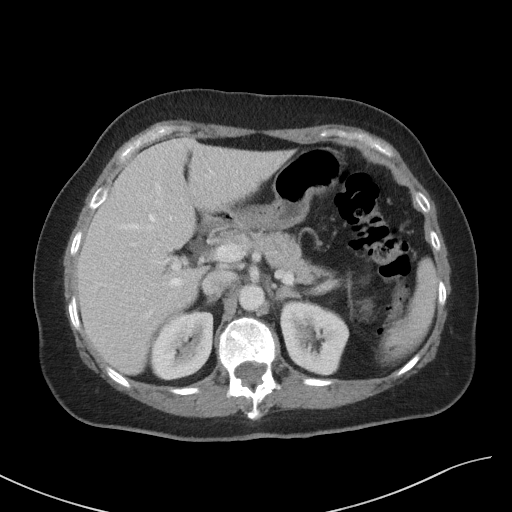
[im 84/96  soft-tissue]
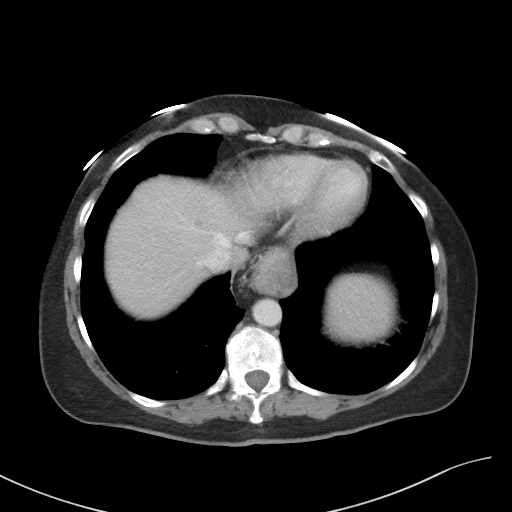
[im 90/96  soft-tissue]
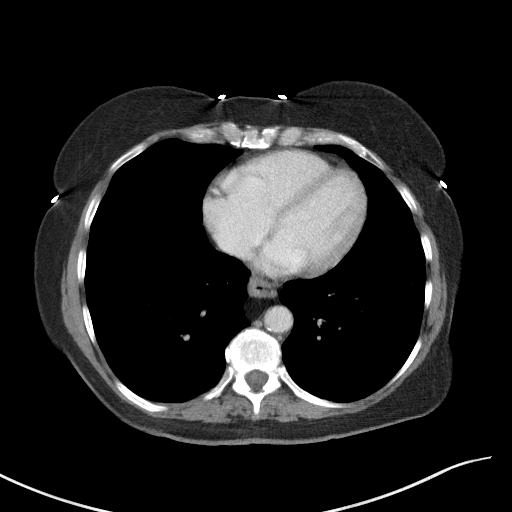

[Series 5: coronal st · coronal · 0.72mm/px · 3 of 90 slices shown]
[im 30/90  soft-tissue]
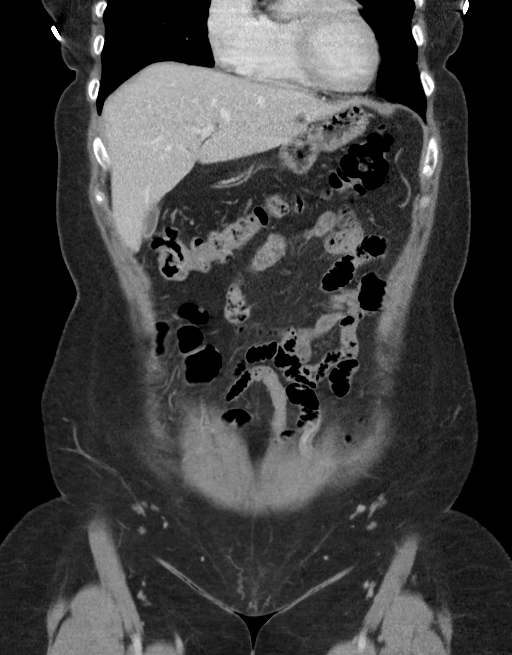
[im 40/90  soft-tissue]
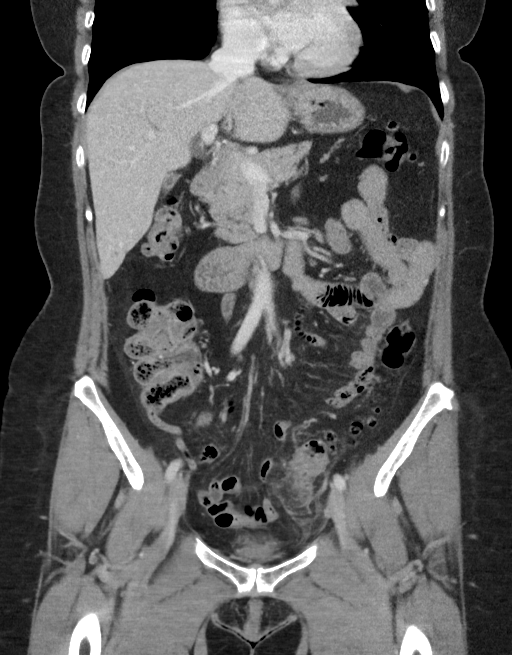
[im 50/90  soft-tissue]
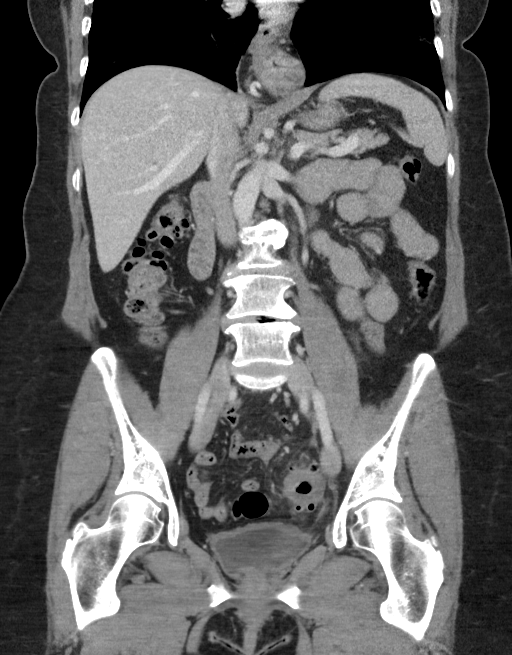

[16 of 46 positions shown; findings below may reference images not displayed]

FINDINGS: Lower chest: No acute abnormality. Small sliding-type hiatal hernia
is noted.

Hepatobiliary: Scattered cysts are noted throughout the liver stable
in appearance from the prior exam. The gallbladder is decompressed.

Pancreas: Unremarkable. No pancreatic ductal dilatation or
surrounding inflammatory changes.

Spleen: Normal in size without focal abnormality.

Adrenals/Urinary Tract: Adrenal glands are within normal limits.
Kidneys are well visualized bilaterally. No renal calculi or urinary
tract obstructive changes seen. Normal excretion of contrast is
noted bilaterally. The bladder is decompressed.

Stomach/Bowel: Diverticular change of the colon is noted without
evidence of diverticulitis in the sigmoid colon with wall thickening
and pericolonic inflammatory change. No evidence of perforation or
abscess formation at this time is noted. The remainder of the colon
is within normal limits. The appendix is unremarkable. No small
bowel abnormality is seen.

Vascular/Lymphatic: No significant vascular findings are present. No
enlarged abdominal or pelvic lymph nodes.

Reproductive: Status post hysterectomy. No adnexal masses.

Other: No abdominal wall hernia or abnormality. No abdominopelvic
ascites.

Musculoskeletal: Degenerative changes of lumbar spine are noted.
IMPRESSION: Changes consistent with sigmoid diverticulitis. No abscess or
perforation is noted at this time.

Small sliding-type hiatal hernia.

No other acute abnormality is noted.

## 2020-09-28 ENCOUNTER — Ambulatory Visit
Admission: EM | Admit: 2020-09-28 | Discharge: 2020-09-28 | Disposition: A | Discharge status: Home / Self Care | Payer: BC Managed Care – PPO | Attending: Emergency Medicine | Admitting: Emergency Medicine

## 2020-09-28 ENCOUNTER — Encounter: Payer: Self-pay | Admitting: Emergency Medicine

## 2020-09-28 DIAGNOSIS — L299 Pruritus, unspecified: Secondary | ICD-10-CM

## 2020-09-28 DIAGNOSIS — L237 Allergic contact dermatitis due to plants, except food: Secondary | ICD-10-CM

## 2020-09-28 DIAGNOSIS — R21 Rash and other nonspecific skin eruption: Secondary | ICD-10-CM

## 2020-09-28 MED ORDER — DEXAMETHASONE SODIUM PHOSPHATE 10 MG/ML IJ SOLN
10.0000 mg | Freq: Once | INTRAMUSCULAR | Status: AC
  Administered 2020-09-28: 10 mg via INTRAMUSCULAR

## 2020-09-28 MED ORDER — TRIAMCINOLONE ACETONIDE 0.1 % EX CREA: 1.0000 "application " | TOPICAL_CREAM | Freq: Two times a day (BID) | CUTANEOUS | 0 refills | Status: AC

## 2020-09-28 NOTE — Discharge Instructions (Addendum)
  Steroid shot given in office Continue with prednisone as prescribed Triamcinolone prescribed Use OTC zyrtec, allegra, or claritin during the day.  Benadryl at night. You may also use OTC hydrocortisone cream and/or calamine lotion to help alleviate itching Referral to dermatology  Return or go to the ED if you have any new or worsening symptoms such as fever, chills, nausea, vomiting, difficulty breathing, throat swelling, tongue swelling, numbness/ tingling in mouth, worsening symptoms despite treatment, etc..Marland Kitchen
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
## 2020-09-28 NOTE — ED Triage Notes (Signed)
Pt has poison oak on arms and legs since x 2 weeks.  Seen at urgent care x 2.  Has had 2 steroid shots and is on day 8 of 10 day prednisone rx.  Pt states she is no better.

## 2020-10-25 ENCOUNTER — Telehealth: Payer: Self-pay | Admitting: Family Medicine

## 2020-10-25 NOTE — Telephone Encounter (Signed)
Called patient to schedule Dermatology appointment, if patient calls back please assist in scheduling. Thanks!  Appointment Notes: Ref by Sharia Reeve [facility]

## 2021-12-02 DIAGNOSIS — E782 Mixed hyperlipidemia: Secondary | ICD-10-CM | POA: Diagnosis not present

## 2021-12-02 DIAGNOSIS — K5792 Diverticulitis of intestine, part unspecified, without perforation or abscess without bleeding: Secondary | ICD-10-CM | POA: Diagnosis not present

## 2021-12-02 DIAGNOSIS — Z0000 Encounter for general adult medical examination without abnormal findings: Secondary | ICD-10-CM | POA: Diagnosis not present

## 2021-12-02 DIAGNOSIS — Z1331 Encounter for screening for depression: Secondary | ICD-10-CM | POA: Diagnosis not present

## 2021-12-02 DIAGNOSIS — E6609 Other obesity due to excess calories: Secondary | ICD-10-CM | POA: Diagnosis not present

## 2021-12-02 DIAGNOSIS — Z6828 Body mass index (BMI) 28.0-28.9, adult: Secondary | ICD-10-CM | POA: Diagnosis not present

## 2021-12-02 DIAGNOSIS — E7849 Other hyperlipidemia: Secondary | ICD-10-CM | POA: Diagnosis not present

## 2022-02-07 DIAGNOSIS — Z1231 Encounter for screening mammogram for malignant neoplasm of breast: Secondary | ICD-10-CM | POA: Diagnosis not present

## 2022-04-01 DIAGNOSIS — U071 COVID-19: Secondary | ICD-10-CM | POA: Diagnosis not present

## 2022-04-01 DIAGNOSIS — J019 Acute sinusitis, unspecified: Secondary | ICD-10-CM | POA: Diagnosis not present

## 2022-05-23 DIAGNOSIS — H2513 Age-related nuclear cataract, bilateral: Secondary | ICD-10-CM | POA: Diagnosis not present

## 2022-05-23 DIAGNOSIS — H40033 Anatomical narrow angle, bilateral: Secondary | ICD-10-CM | POA: Diagnosis not present

## 2022-06-25 DIAGNOSIS — H10013 Acute follicular conjunctivitis, bilateral: Secondary | ICD-10-CM | POA: Diagnosis not present

## 2022-11-17 DIAGNOSIS — J209 Acute bronchitis, unspecified: Secondary | ICD-10-CM | POA: Diagnosis not present

## 2022-11-17 DIAGNOSIS — U071 COVID-19: Secondary | ICD-10-CM | POA: Diagnosis not present

## 2022-11-17 DIAGNOSIS — J069 Acute upper respiratory infection, unspecified: Secondary | ICD-10-CM | POA: Diagnosis not present

## 2022-12-04 DIAGNOSIS — E663 Overweight: Secondary | ICD-10-CM | POA: Diagnosis not present

## 2022-12-04 DIAGNOSIS — E7849 Other hyperlipidemia: Secondary | ICD-10-CM | POA: Diagnosis not present

## 2022-12-04 DIAGNOSIS — K5792 Diverticulitis of intestine, part unspecified, without perforation or abscess without bleeding: Secondary | ICD-10-CM | POA: Diagnosis not present

## 2022-12-04 DIAGNOSIS — E782 Mixed hyperlipidemia: Secondary | ICD-10-CM | POA: Diagnosis not present

## 2022-12-04 DIAGNOSIS — Z1331 Encounter for screening for depression: Secondary | ICD-10-CM | POA: Diagnosis not present

## 2022-12-04 DIAGNOSIS — Z6829 Body mass index (BMI) 29.0-29.9, adult: Secondary | ICD-10-CM | POA: Diagnosis not present

## 2022-12-04 DIAGNOSIS — Z0000 Encounter for general adult medical examination without abnormal findings: Secondary | ICD-10-CM | POA: Diagnosis not present

## 2023-02-12 DIAGNOSIS — Z1231 Encounter for screening mammogram for malignant neoplasm of breast: Secondary | ICD-10-CM | POA: Diagnosis not present

## 2023-07-06 DIAGNOSIS — B001 Herpesviral vesicular dermatitis: Secondary | ICD-10-CM | POA: Diagnosis not present

## 2023-11-10 DIAGNOSIS — Z0000 Encounter for general adult medical examination without abnormal findings: Secondary | ICD-10-CM | POA: Diagnosis not present

## 2023-11-10 DIAGNOSIS — Z1331 Encounter for screening for depression: Secondary | ICD-10-CM | POA: Diagnosis not present

## 2023-11-10 DIAGNOSIS — Z1389 Encounter for screening for other disorder: Secondary | ICD-10-CM | POA: Diagnosis not present

## 2023-11-10 DIAGNOSIS — Z0001 Encounter for general adult medical examination with abnormal findings: Secondary | ICD-10-CM | POA: Diagnosis not present

## 2023-11-10 DIAGNOSIS — K5792 Diverticulitis of intestine, part unspecified, without perforation or abscess without bleeding: Secondary | ICD-10-CM | POA: Diagnosis not present

## 2023-11-10 DIAGNOSIS — Z6829 Body mass index (BMI) 29.0-29.9, adult: Secondary | ICD-10-CM | POA: Diagnosis not present

## 2024-01-12 ENCOUNTER — Encounter: Payer: Self-pay | Admitting: Internal Medicine

## 2024-02-16 DIAGNOSIS — Z1231 Encounter for screening mammogram for malignant neoplasm of breast: Secondary | ICD-10-CM | POA: Diagnosis not present

## 2024-02-24 ENCOUNTER — Ambulatory Visit: Payer: Self-pay

## 2024-02-24 VITALS — Ht 64.0 in | Wt 174.8 lb

## 2024-02-24 DIAGNOSIS — Z860100 Personal history of colon polyps, unspecified: Secondary | ICD-10-CM

## 2024-02-24 MED ORDER — NA SULFATE-K SULFATE-MG SULF 17.5-3.13-1.6 GM/177ML PO SOLN: 1.0000 | Freq: Once | ORAL | 0 refills | Status: AC

## 2024-02-24 NOTE — Progress Notes (Signed)
 No egg or soy allergy known to patient  No issues known to pt with past sedation with any surgeries or procedures Patient denies ever being told they had issues or difficulty with intubation  No FH of Malignant Hyperthermia Pt is not on diet pills Pt is not on  home 02  Pt is not on blood thinners  Pt denies issues with constipation, takes metamucil  No A fib or A flutter Have any cardiac testing pending--No Pt can ambulate  Pt denies use of chewing tobacco Discussed diabetic I weight loss medication holds Discussed NSAID holds Checked BMI Pt instructed to use Single[employer] or GoodRx for a price reduction on prep  Patient's chart reviewed by Norleen Schillings CNRA prior to previsit and patient appropriate for the [facility].  Pre visit completed and red dot placed by patient's name on their procedure day (on provider's schedule).

## 2024-02-29 ENCOUNTER — Encounter: Payer: Self-pay | Admitting: Internal Medicine
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
## 2024-03-09 ENCOUNTER — Ambulatory Visit: Payer: Self-pay | Admitting: Internal Medicine

## 2024-03-09 ENCOUNTER — Encounter: Payer: Self-pay | Admitting: Internal Medicine

## 2024-03-09 VITALS — BP 127/77 | HR 72 | Temp 97.4°F | Resp 20 | Ht 64.0 in | Wt 174.0 lb

## 2024-03-09 DIAGNOSIS — K644 Residual hemorrhoidal skin tags: Secondary | ICD-10-CM

## 2024-03-09 DIAGNOSIS — K5730 Diverticulosis of large intestine without perforation or abscess without bleeding: Secondary | ICD-10-CM

## 2024-03-09 DIAGNOSIS — K635 Polyp of colon: Secondary | ICD-10-CM

## 2024-03-09 DIAGNOSIS — Z860101 Personal history of adenomatous and serrated colon polyps: Secondary | ICD-10-CM

## 2024-03-09 DIAGNOSIS — Z1211 Encounter for screening for malignant neoplasm of colon: Secondary | ICD-10-CM

## 2024-03-09 DIAGNOSIS — D123 Benign neoplasm of transverse colon: Secondary | ICD-10-CM

## 2024-03-09 MED ORDER — SODIUM CHLORIDE 0.9 % IV SOLN: 500.0000 mL | Freq: Once | INTRAVENOUS | Status: DC

## 2024-03-09 NOTE — Op Note (Signed)
 [facility] Patient Name: Elizabeth Raymond Procedure Date: 03/09/2024 10:14 AM MRN: [id number] Endoscopist: Lupita FORBES Commander , MD, [id number] Age: 68 Referring MD:  Date of Birth: 1956/01/03 Gender: Female Account #: [id number] Procedure:                Colonoscopy Indications:              Surveillance: Personal history of adenomatous                            polyps on last colonoscopy 5 years ago, Last                            colonoscopy: 2020 Medicines:                Monitored Anesthesia Care Procedure:                Pre-Anesthesia Assessment:                           - Prior to the procedure, a History and Physical                            was performed, and patient medications and                            allergies were reviewed. The patient's tolerance of                            previous anesthesia was also reviewed. The risks                            and benefits of the procedure and the sedation                            options and risks were discussed with the patient.                            All questions were answered, and informed consent                            was obtained. Prior Anticoagulants: The patient has                            taken no anticoagulant or antiplatelet agents. ASA                            Grade Assessment: II - A patient with mild systemic                            disease. After reviewing the risks and benefits,                            the patient was deemed in satisfactory condition to  undergo the procedure.                           After obtaining informed consent, the colonoscope                            was passed under direct vision. Throughout the                            procedure, the patient's blood pressure, pulse, and                            oxygen saturations were monitored continuously. The                            Olympus Scope SN [id number] was introduced through  the                            anus and advanced to the the cecum, identified by                            appendiceal orifice and ileocecal valve. The                            colonoscopy was performed without difficulty. The                            patient tolerated the procedure well. The quality                            of the bowel preparation was adequate. The                            ileocecal valve, appendiceal orifice, and rectum                            were photographed. The bowel preparation used was                            SUPREP via split dose instruction. Scope In: 10:20:48 AM Scope Out: 10:36:47 AM Scope Withdrawal Time: 0 hours 12 minutes 24 seconds  Total Procedure Duration: 0 hours 15 minutes 59 seconds  Findings:                 Hemorrhoids were found on perianal exam.                           Two sessile polyps were found in the distal                            transverse colon. The polyps were 2 to 5 mm in                            size. These polyps were removed with a cold snare.  Resection and retrieval were complete. Verification                            of patient identification for the specimen was                            done. Estimated blood loss was minimal.                           Many large-mouthed, medium-mouthed and                            small-mouthed diverticula were found in the sigmoid                            colon, descending colon, transverse colon and                            ascending colon.                           The exam was otherwise without abnormality on                            direct and retroflexion views. Complications:            No immediate complications. Estimated Blood Loss:     Estimated blood loss was minimal. Impression:               - Hemorrhoids found on perianal exam.                           - Two 2 to 5 mm polyps in the distal transverse                             colon, removed with a cold snare. Resected and                            retrieved.                           - Diverticulosis in the sigmoid colon, in the                            descending colon, in the transverse colon and in                            the ascending colon.                           - The examination was otherwise normal on direct                            and retroflexion views.                           -  Personal history of colonic polyps. 2013 15 mm TV                            adenoma                           03/28/2019 2 diminutive polyps adenomas Recommendation:           - Patient has a contact number available for                            emergencies. The signs and symptoms of potential                            delayed complications were discussed with the                            patient. Return to normal activities tomorrow.                            Written discharge instructions were provided to the                            patient.                           - Resume previous diet.                           - Continue present medications.                           - Repeat colonoscopy is recommended for                            surveillance. The colonoscopy date will be                            determined after pathology results from today's                            exam become available for review. Lupita FORBES Commander, MD 03/09/2024 10:52:04 AM This report has been signed electronically.

## 2024-03-09 NOTE — Patient Instructions (Addendum)
 I found and removed 2 tiny polyps.  They both look benign.  You also still have diverticulosis.  The polyps will be analyzed and I will let you know when to repeat a routine colonoscopy.  I appreciate the opportunity to care for you.   Lupita FORBES Commander, MD, FACG    YOU HAD AN ENDOSCOPIC PROCEDURE TODAY AT THE [facility]:   Refer to the procedure report that was given to you for any specific questions about what was found during the examination.  If the procedure report does not answer your questions, please call your gastroenterologist to clarify.  If you requested that your care partner not be given the details of your procedure findings, then the procedure report has been included in a sealed envelope for you to review at your convenience later.  YOU SHOULD EXPECT: Some feelings of bloating in the abdomen. Passage of more gas than usual.  Walking can help get rid of the air that was put into your GI tract during the procedure and reduce the bloating. If you had a lower endoscopy (such as a colonoscopy or flexible sigmoidoscopy) you may notice spotting of blood in your stool or on the toilet paper. If you underwent a bowel prep for your procedure, you may not have a normal bowel movement for a few days.  Please Note:  You might notice some irritation and congestion in your nose or some drainage.  This is from the oxygen used during your procedure.  There is no need for concern and it should clear up in a day or so.  SYMPTOMS TO REPORT IMMEDIATELY:  Following lower endoscopy (colonoscopy or flexible sigmoidoscopy):  Excessive amounts of blood in the stool  Significant tenderness or worsening of abdominal pains  Swelling of the abdomen that is new, acute  Fever of 100F or higher  For urgent or emergent issues, a gastroenterologist can be reached at any hour by calling [phone]. Do not use MyChart messaging for urgent concerns.    DIET:  We do recommend a small meal at  first, but then you may proceed to your regular diet.  Drink plenty of fluids but you should avoid alcoholic beverages for 24 hours.  ACTIVITY:  You should plan to take it easy for the rest of today and you should NOT DRIVE or use heavy machinery until tomorrow (because of the sedation medicines used during the test).    FOLLOW UP: Our staff will call the number listed on your records the next business day following your procedure.  We will call around 7:15- 8:00 am to check on you and address any questions or concerns that you may have regarding the information given to you following your procedure. If we do not reach you, we will leave a message.     If any biopsies were taken you will be contacted by phone or by letter within the next 1-3 weeks.  Please call us  at [phone] if you have not heard about the biopsies in 3 weeks.    SIGNATURES/CONFIDENTIALITY: You and/or your care partner have signed paperwork which will be entered into your electronic medical record.  These signatures attest to the fact that that the information above on your After Visit Summary has been reviewed and is understood.  Full responsibility of the confidentiality of this discharge information lies with you and/or your care-partner.

## 2024-03-09 NOTE — Progress Notes (Signed)
 Called to room to assist during endoscopic procedure.  Patient ID and intended procedure confirmed with present staff. Received instructions for my participation in the procedure from the performing physician.

## 2024-03-09 NOTE — Progress Notes (Signed)
 Sedate, gd SR, tolerated procedure well, VSS, report to RN

## 2024-03-09 NOTE — Progress Notes (Signed)
 Pt's states no medical or surgical changes since previsit or office visit.

## 2024-03-10 ENCOUNTER — Telehealth: Payer: Self-pay | Admitting: *Deleted

## 2024-03-10 NOTE — Telephone Encounter (Signed)
  Follow up Call-     03/09/2024    9:23 AM  Call back number  Post procedure Call Back phone  # [phone]  Permission to leave phone message Yes     Patient questions:  Do you have a fever, pain , or abdominal swelling? No. Pain Score  0 *  Have you tolerated food without any problems? Yes.    Have you been able to return to your normal activities? Yes.    Do you have any questions about your discharge instructions: Diet   No. Medications  No. Follow up visit  No.  Do you have questions or concerns about your Care? No.  Actions: * If pain score is 4 or above: No action needed, pain <4.

## 2024-03-11 LAB — SURGICAL PATHOLOGY

## 2024-03-14 ENCOUNTER — Ambulatory Visit: Payer: Self-pay | Admitting: Internal Medicine

## 2024-03-14 DIAGNOSIS — Z860101 Personal history of adenomatous and serrated colon polyps: Secondary | ICD-10-CM
